# Patient Record
Sex: Male | Born: 1985 | Race: White | Hispanic: No | Marital: Married | Smoking: Current every day smoker
Health system: Southern US, Community
[De-identification: ages and names within clinical notes are randomized; demographics above are authoritative.]

## PROBLEM LIST (undated history)

## (undated) DIAGNOSIS — G8929 Other chronic pain: Secondary | ICD-10-CM

## (undated) DIAGNOSIS — F909 Attention-deficit hyperactivity disorder, unspecified type: Secondary | ICD-10-CM

## (undated) DIAGNOSIS — F32A Depression, unspecified: Secondary | ICD-10-CM

## (undated) HISTORY — DX: Attention-deficit hyperactivity disorder, unspecified type: F90.9

## (undated) HISTORY — DX: Depression, unspecified: F32.A

## (undated) HISTORY — DX: Other chronic pain: G89.29

---
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
## 2006-12-23 HISTORY — PX: TUMOR REMOVAL: SHX12

## 2019-03-02 DIAGNOSIS — L723 Sebaceous cyst: Secondary | ICD-10-CM | POA: Diagnosis not present

## 2021-01-11 ENCOUNTER — Telehealth: Payer: BC Managed Care – PPO | Admitting: Internal Medicine

## 2021-01-11 ENCOUNTER — Encounter: Payer: Self-pay | Admitting: Internal Medicine

## 2021-01-11 VITALS — BP 118/78 | HR 72 | Resp 20 | Ht 70.0 in | Wt 200.0 lb

## 2021-01-11 DIAGNOSIS — N521 Erectile dysfunction due to diseases classified elsewhere: Secondary | ICD-10-CM

## 2021-01-11 DIAGNOSIS — M25521 Pain in right elbow: Secondary | ICD-10-CM

## 2021-01-11 DIAGNOSIS — R0683 Snoring: Secondary | ICD-10-CM

## 2021-01-11 DIAGNOSIS — L74513 Primary focal hyperhidrosis, soles: Secondary | ICD-10-CM

## 2021-01-11 DIAGNOSIS — R079 Chest pain, unspecified: Secondary | ICD-10-CM

## 2021-01-11 DIAGNOSIS — M542 Cervicalgia: Secondary | ICD-10-CM | POA: Diagnosis not present

## 2021-01-11 DIAGNOSIS — K921 Melena: Secondary | ICD-10-CM

## 2021-01-11 DIAGNOSIS — G8929 Other chronic pain: Secondary | ICD-10-CM

## 2021-01-11 DIAGNOSIS — N50812 Left testicular pain: Secondary | ICD-10-CM
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
## 2021-01-14 ENCOUNTER — Encounter: Payer: Self-pay | Admitting: Internal Medicine

## 2021-01-14 DIAGNOSIS — M542 Cervicalgia: Secondary | ICD-10-CM | POA: Insufficient documentation

## 2021-01-14 DIAGNOSIS — G8929 Other chronic pain: Secondary | ICD-10-CM | POA: Insufficient documentation

## 2021-01-14 DIAGNOSIS — N521 Erectile dysfunction due to diseases classified elsewhere: Secondary | ICD-10-CM | POA: Insufficient documentation

## 2021-01-14 DIAGNOSIS — L74513 Primary focal hyperhidrosis, soles: Secondary | ICD-10-CM | POA: Insufficient documentation

## 2021-01-14 MED ORDER — ALUMINUM CHLORIDE 20 % EX SOLN: Freq: Every day | CUTANEOUS | 0 refills | Status: DC

## 2021-01-14 NOTE — Patient Instructions (Signed)

## 2021-01-14 NOTE — Assessment & Plan Note (Signed)
Could consider xray of cervical/toracic spine Encouraged daily stretching Continue Naproxen as needed- avoid overuse

## 2021-01-14 NOTE — Assessment & Plan Note (Signed)
Will trial Drysol

## 2021-01-14 NOTE — Assessment & Plan Note (Signed)
Will trial Sildenafil 25 mg as needed Discussed risk of priapism

## 2021-01-15 ENCOUNTER — Other Ambulatory Visit: Payer: BC Managed Care – PPO

## 2021-01-15 LAB — LIPID PANEL
Cholesterol: 142 mg/dL (ref 0–200)
HDL: 48.3 mg/dL (ref >39.00)
LDL Cholesterol: 80 mg/dL (ref 0–99)
NonHDL: 93.81
Total CHOL/HDL Ratio: 3
Triglycerides: 67 mg/dL (ref 0.0–149.0)
VLDL: 13.4 mg/dL (ref 0.0–40.0)

## 2021-01-15 LAB — COMPREHENSIVE METABOLIC PANEL
ALT: 34 U/L (ref 0–53)
AST: 25 U/L (ref 0–37)
Albumin: 3.9 g/dL (ref 3.5–5.2)
Alkaline Phosphatase: 51 U/L (ref 39–117)
BUN: 18 mg/dL (ref 6–23)
CO2: 26 mEq/L (ref 19–32)
Calcium: 8.8 mg/dL (ref 8.4–10.5)
Chloride: 105 mEq/L (ref 96–112)
Creatinine, Ser: 1.1 mg/dL (ref 0.40–1.50)
GFR: 86.3 mL/min (ref >60.00)
Glucose, Bld: 84 mg/dL (ref 70–99)
Potassium: 4.6 mEq/L (ref 3.5–5.1)
Sodium: 140 mEq/L (ref 135–145)
Total Bilirubin: 0.5 mg/dL (ref 0.2–1.2)
Total Protein: 5.9 g/dL — ABNORMAL LOW (ref 6.0–8.3)

## 2021-01-15 LAB — CBC
HCT: 44.1 % (ref 39.0–52.0)
Hemoglobin: 14.7 g/dL (ref 13.0–17.0)
MCHC: 33.4 g/dL (ref 30.0–36.0)
MCV: 90 fl (ref 78.0–100.0)
Platelets: 210 10*3/uL (ref 150.0–400.0)
RBC: 4.89 Mil/uL (ref 4.22–5.81)
RDW: 13.3 % (ref 11.5–15.5)
WBC: 6.5 10*3/uL (ref 4.0–10.5)

## 2021-01-15 LAB — HEMOGLOBIN A1C: Hgb A1c MFr Bld: 5.3 % (ref 4.6–6.5)

## 2021-01-15 LAB — TSH: TSH: 1.24 u[IU]/mL (ref 0.35–4.50)

## 2021-01-15 NOTE — Addendum Note (Signed)
Addended by: Lorre Munroe on: 01/15/2021 08:11 AM   Modules accepted: Orders

## 2021-01-16 ENCOUNTER — Other Ambulatory Visit: Payer: Self-pay | Admitting: Internal Medicine

## 2021-01-16 DIAGNOSIS — M25512 Pain in left shoulder: Secondary | ICD-10-CM

## 2021-01-16 DIAGNOSIS — G8929 Other chronic pain: Secondary | ICD-10-CM

## 2021-01-16 DIAGNOSIS — M542 Cervicalgia: Secondary | ICD-10-CM

## 2021-01-18 ENCOUNTER — Ambulatory Visit (INDEPENDENT_AMBULATORY_CARE_PROVIDER_SITE_OTHER): Payer: BC Managed Care – PPO

## 2021-01-18 ENCOUNTER — Other Ambulatory Visit: Payer: Self-pay

## 2021-01-18 ENCOUNTER — Ambulatory Visit (INDEPENDENT_AMBULATORY_CARE_PROVIDER_SITE_OTHER)
Admission: RE | Admit: 2021-01-18 | Discharge: 2021-01-18 | Disposition: A | Discharge status: Home / Self Care | Payer: BC Managed Care – PPO | Source: Ambulatory Visit | Attending: Internal Medicine | Admitting: Internal Medicine

## 2021-01-18 DIAGNOSIS — M542 Cervicalgia: Secondary | ICD-10-CM | POA: Diagnosis not present

## 2021-01-18 DIAGNOSIS — M25512 Pain in left shoulder: Secondary | ICD-10-CM

## 2021-01-18 DIAGNOSIS — M546 Pain in thoracic spine: Secondary | ICD-10-CM | POA: Diagnosis not present

## 2021-01-18 DIAGNOSIS — R079 Chest pain, unspecified: Secondary | ICD-10-CM | POA: Diagnosis not present

## 2021-01-18 DIAGNOSIS — G8929 Other chronic pain: Secondary | ICD-10-CM | POA: Diagnosis not present

## 2021-01-18 NOTE — Progress Notes (Signed)
Per orders of [name], NP, EKG completed by Roena Malady.

## 2021-02-10 ENCOUNTER — Other Ambulatory Visit: Payer: Self-pay | Admitting: Internal Medicine

## 2021-02-10 MED ORDER — SILDENAFIL CITRATE 50 MG PO TABS: 50.0000 mg | ORAL_TABLET | Freq: Every day | ORAL | 5 refills | Status: DC | PRN

## 2021-02-10 MED ORDER — ALUMINUM CHLORIDE 20 % EX SOLN
Freq: Every day | CUTANEOUS | 2 refills | Status: DC
Start: 2021-02-10 — End: 2021-03-01

## 2021-02-12 ENCOUNTER — Other Ambulatory Visit: Payer: Self-pay | Admitting: Internal Medicine

## 2021-02-12 MED ORDER — PREDNISONE 10 MG PO TABS: ORAL_TABLET | ORAL | 0 refills | Status: DC

## 2021-02-12 MED ORDER — CYCLOBENZAPRINE HCL 10 MG PO TABS: 10.0000 mg | ORAL_TABLET | Freq: Every evening | ORAL | 0 refills | Status: DC | PRN

## 2021-03-01 ENCOUNTER — Other Ambulatory Visit: Payer: Self-pay | Admitting: Internal Medicine

## 2021-03-01 MED ORDER — ALUMINUM CHLORIDE 20 % EX SOLN: Freq: Every day | CUTANEOUS | 2 refills | Status: DC

## 2021-03-19 ENCOUNTER — Other Ambulatory Visit: Payer: Self-pay | Admitting: Internal Medicine

## 2021-03-19 MED ORDER — HYDROCORTISONE ACETATE 25 MG RE SUPP: 25.0000 mg | Freq: Two times a day (BID) | RECTAL | 0 refills | Status: DC

## 2021-05-04 ENCOUNTER — Other Ambulatory Visit: Payer: Self-pay | Admitting: Internal Medicine

## 2021-05-04 MED ORDER — ALUMINUM CHLORIDE 20 % EX SOLN: Freq: Every day | CUTANEOUS | 2 refills | Status: DC

## 2021-06-18 ENCOUNTER — Other Ambulatory Visit: Payer: Self-pay | Admitting: Internal Medicine

## 2021-06-18 ENCOUNTER — Other Ambulatory Visit: Payer: Self-pay

## 2021-06-18 MED ORDER — SILDENAFIL CITRATE 50 MG PO TABS
50.0000 mg | ORAL_TABLET | Freq: Every day | ORAL | 5 refills | Status: AC | PRN
  Filled 2021-06-18: qty 4, 30d supply, fill #0
  Filled 2021-09-12: qty 4, 30d supply, fill #1

## 2021-06-18 MED ORDER — BENZOYL PEROXIDE-ERYTHROMYCIN 5-3 % EX GEL
Freq: Two times a day (BID) | CUTANEOUS | 0 refills | Status: DC
  Filled 2021-06-18: qty 46.6, 30d supply, fill #0

## 2021-06-18 MED ORDER — ALUMINUM CHLORIDE 20 % EX SOLN
Freq: Every day | CUTANEOUS | 2 refills | Status: DC
  Filled 2021-06-18: qty 60, 30d supply, fill #0
  Filled 2021-09-12: qty 70, 30d supply, fill #1
  Filled 2021-09-13: qty 70, 60d supply, fill #1

## 2021-06-19 ENCOUNTER — Other Ambulatory Visit: Payer: Self-pay

## 2021-06-26 ENCOUNTER — Other Ambulatory Visit: Payer: Self-pay | Admitting: Internal Medicine

## 2021-06-26 ENCOUNTER — Other Ambulatory Visit: Payer: Self-pay

## 2021-06-26 MED ORDER — CEPHALEXIN 500 MG PO CAPS
500.0000 mg | ORAL_CAPSULE | Freq: Three times a day (TID) | ORAL | 0 refills | Status: DC
  Filled 2021-06-26: qty 30, 10d supply, fill #0

## 2021-08-29 ENCOUNTER — Encounter: Payer: Self-pay | Admitting: Internal Medicine

## 2021-08-29 ENCOUNTER — Other Ambulatory Visit: Payer: Self-pay

## 2021-08-29 ENCOUNTER — Telehealth (INDEPENDENT_AMBULATORY_CARE_PROVIDER_SITE_OTHER): Payer: BC Managed Care – PPO | Admitting: Internal Medicine

## 2021-08-29 VITALS — Ht 70.0 in | Wt 190.0 lb

## 2021-08-29 DIAGNOSIS — M5414 Radiculopathy, thoracic region: Secondary | ICD-10-CM | POA: Diagnosis not present

## 2021-08-29 DIAGNOSIS — G8929 Other chronic pain: Secondary | ICD-10-CM

## 2021-08-29 DIAGNOSIS — M546 Pain in thoracic spine: Secondary | ICD-10-CM | POA: Diagnosis not present
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
## 2021-08-29 NOTE — Patient Instructions (Signed)
Scapular Winging Rehab Ask your health care provider which exercises are safe for you. Do exercises exactly as told by your health care provider and adjust them as directed. It is normal to feel mild stretching, pulling, tightness, or discomfort as you do these exercises. Stop right away if you feel sudden pain or your pain gets worse. Do not begin these exercises until told by your health care provider. Strengthening exercises These exercises build strength and endurance in your shoulder. Endurance is the ability to use your muscles for a long time, even after they get tired. Scapular depression and retraction After you have practiced this exercise, try doing it without the arm support. Then, try doing it while standing instead of sitting. Sit on a stable chair. Support your arms in front of you with pillows, armrests, or a tabletop. Keep your elbows near the sides of your body. Gently move your shoulder blades down (scapular depression) and back toward your spine (retraction). Relax the muscles on the tops of your shoulders and in the back of your neck. Hold for __________ seconds. Slowly release the tension, and relax your muscles completely before you repeat the exercise. Repeat __________ times. Complete this exercise __________ times a day. Scapular protraction, standing  Stand so you are facing a wall, about one arm-length away from the wall. Place your hands on the wall and straighten your elbows. Move your shoulder blades down, toward the middle of your spine. Keep your shoulder blades down and move them forward, toward the wall (protraction). You should feel your shoulder blades sliding forward, around your rib cage. If you are not sure that you are doing this exercise correctly, ask your health care provider for more instructions. Hold for __________ seconds. Slowly return to the starting position. Let your muscles relax completely before you repeat this exercise. Repeat __________  times. Complete this exercise __________ times a day. Scapular protraction, supine  Lie on your back on a firm surface (supine position). Hold a __________ weight in your left / right hand. Raise your left / right arm straight into the air so your hand is directly above your shoulder joint. Push the weight into the air so your shoulder blade lifts off the surface that you are lying on. The scapula will push forward (protraction). Do not move your head, neck, or back. Hold for __________ seconds. Slowly return to the starting position. Let your muscles relax completely before you repeat this exercise. Repeat __________ times. Complete this exercise __________ times a day. Scapular protraction, quadruped  Get on your hands and knees (quadruped position). Your hands should be directly below your shoulder blades (scapulae). Straighten your arms until your elbows are locked. Round your back as much as you can. Think about lifting your rib cage up into your shoulder blades. The scapula will push downward or forward (protraction). Keep your neck muscles relaxed. Hold for __________ seconds. Slowly return to the starting position. Let your muscles relax completely before you repeat this exercise. Repeat __________ times. Complete this exercise __________ times a day. Scapular depression  Sit in a stable chair that has armrests. Sit upright, with your feet flat on the floor. Put your hands on the armrests with your elbows bent and your fingers pointing forward. Your hands should be about even with the sides of your body. Push down on the armrests to lift yourself off the chair. Straighten your elbows and lift yourself up as much as you comfortably can. Move your shoulder blades down and back (  scapular depression). Do not let your shoulders move up toward your ears. Keep your feet on the ground. As you get stronger, your feet should support less of your body weight as you do this exercise. Hold for  __________ seconds. Slowly lower yourself back into the chair. Repeat __________ times. Complete this exercise __________ times a day. Shoulder extension, prone  Lie on your abdomen on a firm surface (prone position) so your left / right arm hangs over the edge. Hold a __________ weight in your left / right hand so your palm faces in toward your body. Your arm should be straight. Squeeze your shoulder blade down toward the middle of your back. Slowly raise your arm behind you and toward the ceiling, up to the height of the surface that you are lying on (extension). Keep your arm straight. Hold for __________ seconds. Slowly return to the starting position and relax your muscles. Repeat __________ times. Complete this exercise __________ times a day. Horizontal abduction, prone Lie on your abdomen on a firm surface (prone position) so your left / right arm hangs over the edge. Hold a __________ weight in your hand so your palm faces toward your feet. Your arm should be straight. Squeeze your shoulder blade down toward the middle of your back. Raise your left/right arm up to the height of the surface that you are lying on. Keep your arm straight and point your thumb forward. At the top of the movement, your palm should face the floor. Hold for __________ seconds. Slowly return to the starting position and relax your muscles. Repeat __________ times. Complete this exercise __________ times a day. Scapular retraction  Sit in a stable chair without armrests, or stand. Secure an exercise band to a stable object in front of you so it is at shoulder height. Hold one end of the exercise band in each hand. Your palms should face down. Straighten your elbows and lift your arms up to shoulder height. Step back, away from the secured end of the exercise band, until the band stretches. Squeeze your shoulder blades together (scapular retraction) and move your elbows back behind you. Do not shrug your  shoulders while you do this. Your elbows should stay at about chest or shoulder height. Keep your upper arms lifted, away from your sides. Hold for __________ seconds. Slowly return to the starting position. Repeat __________ times. Complete this exercise __________ times a day. Shoulder extension  Sit in a stable chair without armrests, or stand. Secure an exercise band to a stable object in front of you so it is at shoulder height. Hold one end of the exercise band in each hand. Your palms should face each other. Straighten your elbows and lift your hands up to shoulder height. Step back, away from the secured end of the exercise band, until the band stretches. Squeeze your shoulder blades together and pull your hands down to the sides of your thighs (extension). Stop when your hands are straight down by your sides. Do not let your hands go behind your body. Hold for __________ seconds. Slowly return to the starting position. Repeat __________ times. Complete this exercise __________ times a day. Scapular retraction and external rotation  Sit in a stable chair without armrests, or stand. Secure an exercise band to a stable object in front of you so it is at shoulder height. Hold one end of the exercise band in each hand. Your palms should face down. Straighten your elbows and lift your hands up to  shoulder height. Step back, away from the secured end of the exercise band, until the band stretches. Bend your elbows and raise your hands up to the height of your head. This is external rotation. Your palms should face out, in front of you, at the top of the movement. Squeeze your shoulder blades together during this movement. This is scapular retraction. Hold for __________ seconds. Slowly straighten your arms to return to the starting position. Repeat __________ times. Complete this exercise __________ times a day. This information is not intended to replace advice given to you by your  health care provider. Make sure you discuss any questions you have with your health care provider. Document Revised: 04/02/2019 Document Reviewed: 10/15/2018 Elsevier Patient Education  2022 [employer].

## 2021-09-07 ENCOUNTER — Telehealth: Payer: Self-pay

## 2021-09-07 DIAGNOSIS — M5414 Radiculopathy, thoracic region: Secondary | ICD-10-CM

## 2021-09-07 NOTE — Addendum Note (Signed)
Addended by: Lorre Munroe on: 09/07/2021 12:12 PM   Modules accepted: Orders

## 2021-09-07 NOTE — Telephone Encounter (Signed)
Copied from CRM [id number]. Topic: Referral - Status >> Sep 06, 2021 12:22 PM Crist Infante wrote: Reason for CRM: [employer] calling to request the referral be sent to [facility] for the MRI. New order needs to be sent, faxed to [phone] Ref no. [id number]

## 2021-09-07 NOTE — Telephone Encounter (Signed)
New MRI ordered placed. Can we get this faxed to [facility]?

## 2021-09-12 ENCOUNTER — Other Ambulatory Visit: Payer: Self-pay | Admitting: Internal Medicine

## 2021-09-12 ENCOUNTER — Ambulatory Visit: Payer: BC Managed Care – PPO

## 2021-09-12 ENCOUNTER — Other Ambulatory Visit: Payer: Self-pay

## 2021-09-12 MED ORDER — BENZOYL PEROXIDE-ERYTHROMYCIN 5-3 % EX GEL
Freq: Two times a day (BID) | CUTANEOUS | 5 refills | Status: DC
  Filled 2021-09-12: qty 46.6, 30d supply, fill #0

## 2021-09-12 MED FILL — Benzoyl Peroxide-Erythromycin Gel 5-3%: CUTANEOUS | 30 days supply | Qty: 46.6 | Fill #0 | Status: CN

## 2021-09-13 ENCOUNTER — Other Ambulatory Visit: Payer: Self-pay

## 2021-09-14 ENCOUNTER — Other Ambulatory Visit: Payer: Self-pay | Admitting: Internal Medicine

## 2021-09-25 DIAGNOSIS — K1321 Leukoplakia of oral mucosa, including tongue: Secondary | ICD-10-CM | POA: Diagnosis not present

## 2021-10-02 ENCOUNTER — Encounter: Payer: Self-pay | Admitting: Internal Medicine

## 2021-10-02 DIAGNOSIS — M5414 Radiculopathy, thoracic region: Secondary | ICD-10-CM | POA: Diagnosis not present

## 2021-10-19 DIAGNOSIS — K1329 Other disturbances of oral epithelium, including tongue: Secondary | ICD-10-CM | POA: Diagnosis not present

## 2021-10-22 DIAGNOSIS — K1329 Other disturbances of oral epithelium, including tongue: Secondary | ICD-10-CM | POA: Diagnosis not present

## 2021-12-06 ENCOUNTER — Other Ambulatory Visit: Payer: Self-pay | Admitting: Internal Medicine

## 2021-12-06 ENCOUNTER — Other Ambulatory Visit: Payer: Self-pay

## 2021-12-06 MED ORDER — PHENTERMINE HCL 37.5 MG PO CAPS
37.5000 mg | ORAL_CAPSULE | ORAL | 2 refills | Status: DC
  Filled 2021-12-06: qty 30, 30d supply, fill #0
  Filled 2022-02-27: qty 30, 30d supply, fill #1

## 2022-02-12 ENCOUNTER — Other Ambulatory Visit: Payer: Self-pay | Admitting: Internal Medicine

## 2022-02-12 ENCOUNTER — Other Ambulatory Visit: Payer: Self-pay

## 2022-02-12 MED ORDER — HYDROCORTISONE ACE-PRAMOXINE 1-1 % EX CREA
1.0000 "application " | TOPICAL_CREAM | Freq: Two times a day (BID) | CUTANEOUS | 0 refills | Status: DC
  Filled 2022-02-12: qty 30, 15d supply, fill #0

## 2022-02-12 MED ORDER — HYDROCORTISONE ACETATE 25 MG RE SUPP
25.0000 mg | Freq: Two times a day (BID) | RECTAL | 0 refills | Status: DC
Start: 2022-02-12 — End: 2022-02-12
  Filled 2022-02-12: qty 12, 6d supply, fill #0

## 2022-02-14 ENCOUNTER — Other Ambulatory Visit: Payer: Self-pay

## 2022-02-14 MED ORDER — HYDROCORTISONE (PERIANAL) 2.5 % EX CREA
TOPICAL_CREAM | CUTANEOUS | 0 refills | Status: DC
  Filled 2022-02-14: qty 28, 15d supply, fill #0
  Filled 2022-02-27: qty 28, 7d supply, fill #0

## 2022-02-22 ENCOUNTER — Other Ambulatory Visit: Payer: Self-pay

## 2022-02-27 ENCOUNTER — Other Ambulatory Visit: Payer: Self-pay

## 2022-03-15 ENCOUNTER — Other Ambulatory Visit: Payer: Self-pay

## 2022-03-15 DIAGNOSIS — S92334A Nondisplaced fracture of third metatarsal bone, right foot, initial encounter for closed fracture: Secondary | ICD-10-CM | POA: Diagnosis not present

## 2022-03-15 DIAGNOSIS — M79641 Pain in right hand: Secondary | ICD-10-CM | POA: Diagnosis not present

## 2022-03-15 DIAGNOSIS — M79671 Pain in right foot: Secondary | ICD-10-CM | POA: Diagnosis not present

## 2022-03-15 DIAGNOSIS — M25531 Pain in right wrist: Secondary | ICD-10-CM | POA: Diagnosis not present

## 2022-03-15 DIAGNOSIS — S92344A Nondisplaced fracture of fourth metatarsal bone, right foot, initial encounter for closed fracture: Secondary | ICD-10-CM | POA: Diagnosis not present

## 2022-03-15 DIAGNOSIS — S6991XA Unspecified injury of right wrist, hand and finger(s), initial encounter: Secondary | ICD-10-CM | POA: Diagnosis not present

## 2022-03-15 MED ORDER — OXYCODONE-ACETAMINOPHEN 5-325 MG PO TABS
ORAL_TABLET | ORAL | 0 refills | Status: DC
  Filled 2022-03-15: qty 20, 5d supply, fill #0

## 2022-03-15 MED ORDER — METHOCARBAMOL 500 MG PO TABS
ORAL_TABLET | ORAL | 0 refills | Status: DC
  Filled 2022-03-15: qty 45, 15d supply, fill #0

## 2022-03-18 ENCOUNTER — Ambulatory Visit
Admission: RE | Admit: 2022-03-18 | Discharge: 2022-03-18 | Disposition: A | Discharge status: Home / Self Care | Payer: BC Managed Care – PPO | Source: Ambulatory Visit | Attending: Internal Medicine | Admitting: Internal Medicine

## 2022-03-18 ENCOUNTER — Ambulatory Visit
Admission: RE | Admit: 2022-03-18 | Discharge: 2022-03-18 | Disposition: A | Discharge status: Home / Self Care | Payer: BC Managed Care – PPO | Attending: Internal Medicine | Admitting: Internal Medicine

## 2022-03-18 ENCOUNTER — Other Ambulatory Visit: Payer: Self-pay

## 2022-03-18 ENCOUNTER — Other Ambulatory Visit: Payer: Self-pay | Admitting: Internal Medicine

## 2022-03-18 DIAGNOSIS — M79641 Pain in right hand: Secondary | ICD-10-CM | POA: Diagnosis not present

## 2022-03-22 ENCOUNTER — Other Ambulatory Visit: Payer: Self-pay

## 2022-03-22 MED ORDER — TIZANIDINE HCL 4 MG PO TABS
ORAL_TABLET | ORAL | 0 refills | Status: DC
Start: 2022-03-22 — End: 2022-08-08
  Filled 2022-03-22: qty 120, 30d supply, fill #0

## 2022-03-22 MED ORDER — MELOXICAM 7.5 MG PO TABS
ORAL_TABLET | ORAL | 0 refills | Status: DC
Start: 2022-03-22 — End: 2022-08-08
  Filled 2022-03-22: qty 30, 30d supply, fill #0

## 2022-03-22 MED ORDER — GABAPENTIN 300 MG PO CAPS
ORAL_CAPSULE | ORAL | 0 refills | Status: DC
Start: 2022-03-22 — End: 2022-08-08
  Filled 2022-03-22: qty 60, 30d supply, fill #0

## 2022-04-27 ENCOUNTER — Other Ambulatory Visit (HOSPITAL_COMMUNITY): Payer: Self-pay

## 2022-05-16 ENCOUNTER — Other Ambulatory Visit: Payer: Self-pay | Admitting: Internal Medicine

## 2022-05-16 MED ORDER — PHENTERMINE HCL 37.5 MG PO CAPS: 37.5000 mg | ORAL_CAPSULE | ORAL | 2 refills | Status: DC

## 2022-05-16 MED ORDER — INSULIN PEN NEEDLE 31G X 5 MM MISC
1 refills | Status: DC
Start: 2022-05-16 — End: 2022-08-08

## 2022-07-12 ENCOUNTER — Other Ambulatory Visit: Payer: Self-pay

## 2022-07-30 ENCOUNTER — Other Ambulatory Visit (HOSPITAL_COMMUNITY): Payer: Self-pay

## 2022-08-08 ENCOUNTER — Other Ambulatory Visit: Payer: Self-pay | Admitting: Internal Medicine

## 2022-08-08 MED ORDER — NITROGLYCERIN 0.4 % RE OINT: TOPICAL_OINTMENT | RECTAL | 0 refills | Status: DC

## 2022-09-10 ENCOUNTER — Other Ambulatory Visit: Payer: Self-pay | Admitting: Internal Medicine

## 2022-09-10 MED ORDER — NITROFURANTOIN MONOHYD MACRO 100 MG PO CAPS: 100.0000 mg | ORAL_CAPSULE | Freq: Two times a day (BID) | ORAL | 0 refills | Status: DC

## 2022-09-10 MED ORDER — FLUCONAZOLE 150 MG PO TABS: 150.0000 mg | ORAL_TABLET | Freq: Once | ORAL | 0 refills | Status: AC

## 2023-02-04 ENCOUNTER — Other Ambulatory Visit: Payer: Self-pay | Admitting: Internal Medicine

## 2023-02-04 MED ORDER — INSULIN PEN NEEDLE 31G X 5 MM MISC: 3 refills | Status: DC

## 2023-02-04 MED ORDER — CEPHALEXIN 500 MG PO CAPS: 500.0000 mg | ORAL_CAPSULE | Freq: Three times a day (TID) | ORAL | 0 refills | Status: DC

## 2023-02-18 ENCOUNTER — Other Ambulatory Visit: Payer: Self-pay | Admitting: Internal Medicine

## 2023-02-18 MED ORDER — DOXYCYCLINE HYCLATE 100 MG PO TABS: 100.0000 mg | ORAL_TABLET | Freq: Two times a day (BID) | ORAL | 0 refills | Status: DC

## 2023-02-18 MED ORDER — PHENTERMINE HCL 37.5 MG PO CAPS: 37.5000 mg | ORAL_CAPSULE | ORAL | 0 refills | Status: DC

## 2023-06-20 ENCOUNTER — Other Ambulatory Visit: Payer: Self-pay | Admitting: Internal Medicine

## 2023-06-20 MED ORDER — PREDNISONE 10 MG PO TABS
10.0000 mg | ORAL_TABLET | Freq: Every day | ORAL | 0 refills | Status: DC
Start: 2023-06-20 — End: 2023-08-04

## 2023-07-01 ENCOUNTER — Other Ambulatory Visit: Payer: Self-pay | Admitting: Internal Medicine

## 2023-07-01 MED ORDER — CEPHALEXIN 500 MG PO CAPS: 500.0000 mg | ORAL_CAPSULE | Freq: Three times a day (TID) | ORAL | 0 refills | Status: DC

## 2023-08-04 ENCOUNTER — Other Ambulatory Visit: Payer: Self-pay | Admitting: Internal Medicine
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
## 2023-08-04 MED ORDER — CIPROFLOXACIN HCL 500 MG PO TABS: 500.0000 mg | ORAL_TABLET | Freq: Two times a day (BID) | ORAL | 0 refills | Status: DC

## 2023-08-11 ENCOUNTER — Ambulatory Visit (INDEPENDENT_AMBULATORY_CARE_PROVIDER_SITE_OTHER): Payer: 59 | Admitting: Nurse Practitioner

## 2023-08-11 ENCOUNTER — Encounter: Payer: Self-pay | Admitting: Nurse Practitioner

## 2023-08-11 VITALS — BP 110/88 | HR 75 | Temp 98.9°F | Ht 69.0 in | Wt 213.8 lb

## 2023-08-11 DIAGNOSIS — Z1159 Encounter for screening for other viral diseases: Secondary | ICD-10-CM | POA: Diagnosis not present

## 2023-08-11 DIAGNOSIS — Z114 Encounter for screening for human immunodeficiency virus [HIV]: Secondary | ICD-10-CM | POA: Diagnosis not present

## 2023-08-11 DIAGNOSIS — Z13220 Encounter for screening for lipoid disorders: Secondary | ICD-10-CM | POA: Diagnosis not present

## 2023-08-11 DIAGNOSIS — Z0000 Encounter for general adult medical examination without abnormal findings: Secondary | ICD-10-CM | POA: Diagnosis not present

## 2023-08-11 DIAGNOSIS — E669 Obesity, unspecified: Secondary | ICD-10-CM

## 2023-08-11 DIAGNOSIS — Z720 Tobacco use: Secondary | ICD-10-CM

## 2023-08-11 LAB — CBC
HCT: 45.8 % (ref 39.0–52.0)
Hemoglobin: 15 g/dL (ref 13.0–17.0)
MCHC: 32.7 g/dL (ref 30.0–36.0)
MCV: 89.5 fl (ref 78.0–100.0)
Platelets: 297 10*3/uL (ref 150.0–400.0)
RBC: 5.12 Mil/uL (ref 4.22–5.81)
RDW: 13.8 % (ref 11.5–15.5)
WBC: 7.2 10*3/uL (ref 4.0–10.5)

## 2023-08-11 LAB — TSH: TSH: 0.93 u[IU]/mL (ref 0.35–5.50)

## 2023-08-11 LAB — URINALYSIS, MICROSCOPIC ONLY
RBC / HPF: NONE SEEN (ref 0–?)
WBC, UA: NONE SEEN (ref 0–?)

## 2023-08-11 LAB — HEMOGLOBIN A1C: Hgb A1c MFr Bld: 5.5 % (ref 4.6–6.5)

## 2023-08-11 NOTE — Patient Instructions (Signed)
Nice to see you today I will be in touch with the labs once I have reviewed them Follow up with me in 1 year, sooner if you need me  
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
## 2023-08-11 NOTE — Assessment & Plan Note (Signed)
Pending TSH, A1c, lipid panel.  Work on healthy lifestyle modifications inclusive of exercise 30 minutes a day 5 times a week.

## 2023-08-11 NOTE — Assessment & Plan Note (Signed)
Discussed age-appropriate immunizations and screening exams.  Did review patient's personal, surgical, social, family histories.  Patient is up-to-date with all age-appropriate vaccinations that he would like.  Patient is too young for CRC screening or prostate cancer screening.  Patient was given information at discharge about preventative healthcare maintenance with anticipatory guidance.  Did inform patient that the recommendation is no more than 2 alcoholic beverages at 1 setting per day.

## 2023-08-11 NOTE — Assessment & Plan Note (Signed)
Patient current everyday smoker.  Check urine microscopy for microscopic hematuria

## 2023-08-12 ENCOUNTER — Encounter: Payer: Self-pay | Admitting: Nurse Practitioner

## 2023-08-12 LAB — LIPID PANEL
Cholesterol: 216 mg/dL — ABNORMAL HIGH (ref 0–200)
HDL: 54.1 mg/dL (ref >39.00)
LDL Cholesterol: 151 mg/dL — ABNORMAL HIGH (ref 0–99)
NonHDL: 162.22
Total CHOL/HDL Ratio: 4
Triglycerides: 55 mg/dL (ref 0.0–149.0)
VLDL: 11 mg/dL (ref 0.0–40.0)

## 2023-08-12 LAB — COMPREHENSIVE METABOLIC PANEL
ALT: 48 U/L (ref 0–53)
AST: 26 U/L (ref 0–37)
Albumin: 4.6 g/dL (ref 3.5–5.2)
Alkaline Phosphatase: 87 U/L (ref 39–117)
BUN: 13 mg/dL (ref 6–23)
CO2: 25 meq/L (ref 19–32)
Calcium: 9.6 mg/dL (ref 8.4–10.5)
Chloride: 104 mEq/L (ref 96–112)
Creatinine, Ser: 1 mg/dL (ref 0.40–1.50)
GFR: 95.03 mL/min (ref >60.00)
Glucose, Bld: 97 mg/dL (ref 70–99)
Potassium: 4 meq/L (ref 3.5–5.1)
Sodium: 139 meq/L (ref 135–145)
Total Bilirubin: 0.7 mg/dL (ref 0.2–1.2)
Total Protein: 7.2 g/dL (ref 6.0–8.3)

## 2023-08-12 LAB — HEPATITIS C ANTIBODY: Hepatitis C Ab: NONREACTIVE

## 2023-08-12 LAB — HIV ANTIBODY (ROUTINE TESTING W REFLEX): HIV 1&2 Ab, 4th Generation: NONREACTIVE

## 2023-09-12 ENCOUNTER — Encounter: Payer: Self-pay | Admitting: Nurse Practitioner

## 2023-10-13 ENCOUNTER — Other Ambulatory Visit: Payer: Self-pay | Admitting: Internal Medicine

## 2023-10-13 MED ORDER — AZITHROMYCIN 250 MG PO TABS: ORAL_TABLET | ORAL | 0 refills | Status: DC

## 2023-12-04 ENCOUNTER — Other Ambulatory Visit: Payer: Self-pay | Admitting: Internal Medicine

## 2023-12-15 ENCOUNTER — Other Ambulatory Visit: Payer: Self-pay | Admitting: Internal Medicine

## 2023-12-15 MED ORDER — HYDROCORTISONE (PERIANAL) 2.5 % EX CREA: 1.0000 | TOPICAL_CREAM | Freq: Two times a day (BID) | CUTANEOUS | 0 refills | Status: DC

## 2024-02-22 ENCOUNTER — Other Ambulatory Visit: Payer: Self-pay | Admitting: Internal Medicine

## 2024-02-22 MED ORDER — POLYMYXIN B-TRIMETHOPRIM 10000-0.1 UNIT/ML-% OP SOLN: 1.0000 [drp] | OPHTHALMIC | 0 refills | Status: DC

## 2024-03-03 ENCOUNTER — Other Ambulatory Visit: Payer: Self-pay | Admitting: Internal Medicine

## 2024-03-03 MED ORDER — PREDNISONE 10 MG PO TABS: 10.0000 mg | ORAL_TABLET | Freq: Every day | ORAL | 0 refills | Status: DC

## 2024-03-03 MED ORDER — AZITHROMYCIN 250 MG PO TABS: ORAL_TABLET | ORAL | 0 refills | Status: DC

## 2024-04-05 ENCOUNTER — Ambulatory Visit: Admitting: Nurse Practitioner

## 2024-04-05 ENCOUNTER — Encounter: Payer: Self-pay | Admitting: Nurse Practitioner

## 2024-04-05 VITALS — BP 130/88 | HR 78 | Temp 98.1°F | Ht 69.0 in | Wt 220.2 lb

## 2024-04-05 DIAGNOSIS — L0291 Cutaneous abscess, unspecified: Secondary | ICD-10-CM | POA: Diagnosis not present

## 2024-04-05 DIAGNOSIS — L74513 Primary focal hyperhidrosis, soles: Secondary | ICD-10-CM

## 2024-04-05 MED ORDER — DOXYCYCLINE HYCLATE 100 MG PO TABS: 100.0000 mg | ORAL_TABLET | Freq: Two times a day (BID) | ORAL | 0 refills | Status: AC

## 2024-04-05 NOTE — Assessment & Plan Note (Signed)
 Patient can use an aluminum chloride hexahydrate nightly with washing the morning.  What sweating is more to control patient can do 2-3 times weekly.  First patient did bring an extra pair of socks at work if socks become soiled with sweat

## 2024-04-05 NOTE — Patient Instructions (Addendum)
 Nice to see you today You are due for your physical in approx 4 months Try the drysol to the feet at night. Be sure to wash off in the morning. If the sweating starts doing better you can decrease to 2-3 times a week

## 2024-04-05 NOTE — Progress Notes (Signed)
 Acute Office Visit  Subjective:     Patient ID: Tyler Ali, male    DOB: 09-May-1986, 38 y.o.   MRN: [id number]  Chief Complaint  Patient presents with   Recurrent Skin Infections    Pt complains of boil near groin/thigh area that has been there for a month. Pt states it can get tender and painful. Pt states he has drained it a few times.    Foot Problem    Pt complains of possible fungal infection. States he has this issue for a couple years on and off.     HPI Patient is in today for multiple complaints with a history of hyperhidrosis of feet, obesity, and tobacco use  Has  histroy of hyperhydrosis and has dealt with it for years. He use to use drysol for the sweatin gin the past. He does work with sheet metal and socks get moist  States that he has gotten a boil on the inside of his leg and has been there for a month. More so in the summer than the winter. Had to have it lanced a few times. States that it has been opened within the past month. States that he has not had any in the arm pits. States that he does not have a fever and has not tried any antibtio  Review of Systems  Constitutional:  Negative for chills and fever.  Respiratory:  Negative for shortness of breath.   Cardiovascular:  Negative for chest pain.  Skin:        "+" lesion        Objective:    BP 130/88   Pulse 78   Temp 98.1 F (36.7 C) (Oral)   Ht 5\' 9"  (1.753 m)   Wt 220 lb 3.2 oz (99.9 kg)   SpO2 95%   BMI 32.52 kg/m  BP Readings from Last 3 Encounters:  04/05/24 130/88  08/11/23 110/88  01/11/21 118/78   Wt Readings from Last 3 Encounters:  04/05/24 220 lb 3.2 oz (99.9 kg)  08/11/23 213 lb 12.8 oz (97 kg)  08/29/21 190 lb (86.2 kg)   SpO2 Readings from Last 3 Encounters:  04/05/24 95%  08/11/23 97%  01/11/21 95%      Physical Exam Vitals and nursing note reviewed.  Constitutional:      Appearance: Normal appearance.  Cardiovascular:     Rate and Rhythm: Normal rate and  regular rhythm.     Heart sounds: Normal heart sounds.  Pulmonary:     Effort: Pulmonary effort is normal.     Breath sounds: Normal breath sounds.  Feet:     Comments: Peeling skin from maceration on plantar surface. No rash appreciated  Skin:    Findings: Lesion present.       Neurological:     Mental Status: He is alert.     No results found for any visits on 04/05/24.      Assessment & Plan:   Problem List Items Addressed This Visit       Musculoskeletal and Integument   Hyperhidrosis of feet - Primary   Patient can use an aluminum chloride hexahydrate nightly with washing the morning.  What sweating is more to control patient can do 2-3 times weekly.  First patient did bring an extra pair of socks at work if socks become soiled with sweat        Other   Abscess   Recurrent abscess to the thigh area.  An area with skin  does rub and hair present.  Patient has had I&D in the past area of induration today no fluctuance.  Will treat with doxycycline 100 mg twice daily.      Relevant Medications   doxycycline (VIBRA-TABS) 100 MG tablet    Meds ordered this encounter  Medications   doxycycline (VIBRA-TABS) 100 MG tablet    Sig: Take 1 tablet (100 mg total) by mouth 2 (two) times daily for 7 days.    Dispense:  14 tablet    Refill:  0    Supervising Provider:   Deri Fleet A [[id number]]    Return in about 4 months (around 08/05/2024).  Margarie Shay, NP

## 2024-04-05 NOTE — Assessment & Plan Note (Signed)
 Recurrent abscess to the thigh area.  An area with skin does rub and hair present.  Patient has had I&D in the past area of induration today no fluctuance.  Will treat with doxycycline 100 mg twice daily.

## 2024-05-30 ENCOUNTER — Other Ambulatory Visit: Payer: Self-pay | Admitting: Internal Medicine

## 2024-08-04 ENCOUNTER — Encounter: Payer: Self-pay | Admitting: Nurse Practitioner

## 2024-08-04 DIAGNOSIS — Z0100 Encounter for examination of eyes and vision without abnormal findings: Secondary | ICD-10-CM | POA: Diagnosis not present

## 2024-08-05 ENCOUNTER — Other Ambulatory Visit: Payer: Self-pay

## 2024-08-11 ENCOUNTER — Encounter: Payer: Self-pay | Admitting: Primary Care

## 2024-08-11 ENCOUNTER — Ambulatory Visit (INDEPENDENT_AMBULATORY_CARE_PROVIDER_SITE_OTHER): Admitting: Primary Care

## 2024-08-11 VITALS — BP 118/76 | HR 81 | Temp 97.9°F | Ht 69.0 in | Wt 219.0 lb

## 2024-08-11 DIAGNOSIS — Z131 Encounter for screening for diabetes mellitus: Secondary | ICD-10-CM | POA: Diagnosis not present

## 2024-08-11 DIAGNOSIS — E785 Hyperlipidemia, unspecified: Secondary | ICD-10-CM

## 2024-08-11 DIAGNOSIS — Z0000 Encounter for general adult medical examination without abnormal findings: Secondary | ICD-10-CM | POA: Diagnosis not present

## 2024-08-11 DIAGNOSIS — L74513 Primary focal hyperhidrosis, soles: Secondary | ICD-10-CM

## 2024-08-11 NOTE — Assessment & Plan Note (Signed)
 Immunizations UTD per patient  Discussed the importance of a healthy diet and regular exercise in order for weight loss, and to reduce the risk of further co-morbidity.  Exam stable. Labs pending.  Follow up in 1 year for repeat physical.

## 2024-08-11 NOTE — Assessment & Plan Note (Addendum)
 Controlled.  Continue Drysol PRN

## 2024-08-11 NOTE — Patient Instructions (Signed)
 Stop by the lab prior to leaving today. I will notify you of your results once received.   It was a pleasure to see you today!
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
## 2024-08-12 ENCOUNTER — Other Ambulatory Visit: Payer: Self-pay

## 2024-08-12 ENCOUNTER — Ambulatory Visit: Payer: Self-pay | Admitting: Primary Care

## 2024-08-12 LAB — CBC
HCT: 44.8 % (ref 39.0–52.0)
Hemoglobin: 14.9 g/dL (ref 13.0–17.0)
MCHC: 33.2 g/dL (ref 30.0–36.0)
MCV: 89.7 fl (ref 78.0–100.0)
Platelets: 236 K/uL (ref 150.0–400.0)
RBC: 4.99 Mil/uL (ref 4.22–5.81)
RDW: 13.1 % (ref 11.5–15.5)
WBC: 7.4 K/uL (ref 4.0–10.5)

## 2024-08-12 LAB — COMPREHENSIVE METABOLIC PANEL WITH GFR
ALT: 57 U/L — ABNORMAL HIGH (ref 0–53)
AST: 35 U/L (ref 0–37)
Albumin: 4.7 g/dL (ref 3.5–5.2)
Alkaline Phosphatase: 73 U/L (ref 39–117)
BUN: 15 mg/dL (ref 6–23)
CO2: 29 meq/L (ref 19–32)
Calcium: 9.5 mg/dL (ref 8.4–10.5)
Chloride: 100 meq/L (ref 96–112)
Creatinine, Ser: 1.04 mg/dL (ref 0.40–1.50)
GFR: 90.02 mL/min (ref >60.00)
Glucose, Bld: 80 mg/dL (ref 70–99)
Potassium: 4.6 meq/L (ref 3.5–5.1)
Sodium: 139 meq/L (ref 135–145)
Total Bilirubin: 0.9 mg/dL (ref 0.2–1.2)
Total Protein: 7.1 g/dL (ref 6.0–8.3)

## 2024-08-12 LAB — LIPID PANEL
Cholesterol: 182 mg/dL (ref 0–200)
HDL: 52.2 mg/dL (ref >39.00)
LDL Cholesterol: 121 mg/dL — ABNORMAL HIGH (ref 0–99)
NonHDL: 129.97
Total CHOL/HDL Ratio: 3
Triglycerides: 46 mg/dL (ref 0.0–149.0)
VLDL: 9.2 mg/dL (ref 0.0–40.0)

## 2024-08-12 LAB — HEMOGLOBIN A1C: Hgb A1c MFr Bld: 5.6 % (ref 4.6–6.5)

## 2024-08-29 ENCOUNTER — Encounter

## 2024-09-07 DIAGNOSIS — L03116 Cellulitis of left lower limb: Secondary | ICD-10-CM

## 2024-09-07 MED ORDER — PREDNISONE 20 MG PO TABS: ORAL_TABLET | ORAL | 0 refills | Status: DC

## 2024-09-07 MED ORDER — AMOXICILLIN-POT CLAVULANATE 875-125 MG PO TABS: 1.0000 | ORAL_TABLET | Freq: Two times a day (BID) | ORAL | 0 refills | Status: DC

## 2024-09-21 ENCOUNTER — Ambulatory Visit: Admitting: Nurse Practitioner

## 2024-09-21 VITALS — BP 124/82 | HR 76 | Temp 98.3°F | Ht 69.0 in | Wt 219.2 lb

## 2024-09-21 DIAGNOSIS — L0292 Furuncle, unspecified: Secondary | ICD-10-CM | POA: Diagnosis not present

## 2024-09-21 NOTE — Patient Instructions (Signed)
 Nice to see you today I have made a referral to Dermatology, they should reach out to you in approx 2 weeks Follow up with me as needed

## 2024-09-21 NOTE — Progress Notes (Signed)
 Acute Office Visit  Subjective:     Patient ID: Tyler Ali, male    DOB: 07/04/1986, 38 y.o.   MRN: [id number]  Chief Complaint  Patient presents with   Blisters    Pt complains of off and on boils for a few years. States they are becoming more frequent. Blisters are painful. Would like referral to derm.     HPI  Discussed the use of AI scribe software for clinical note transcription with the patient, who gave verbal consent to proceed.  History of Present Illness Tyler Ali is a 38 year old male with hidradenitis suppurativa who presents with recurrent lesions.  He has been experiencing hidradenitis suppurativa for several years, with exacerbations during the summer months. Recently, the lesions have increased in frequency, primarily affecting his leg, without involvement of the scrotum or genitals.  He has previously been treated with antibiotics, including Augmentin , which provided partial relief but did not completely resolve the symptoms. He is allergic to sulfa drugs and has tried other antibiotics in the past. Lesions have been lanced previously, aiding in drainage but not fully resolving the issue.  Currently, there is no fever or chills, and no active drainage from the lesions. A recent lesion was large and required lancing, which helped to some extent but the lesion is still present. He has stopped shaving the affected area to prevent further irritation and follicle entrapment.   Review of Systems  Constitutional:  Negative for chills and fever.  Respiratory:  Negative for shortness of breath.   Cardiovascular:  Negative for chest pain.  Skin:  Positive for rash.        Objective:    BP 124/82   Pulse 76   Temp 98.3 F (36.8 C) (Oral)   Ht 5' 9 (1.753 m)   Wt 219 lb 3.2 oz (99.4 kg)   SpO2 97%   BMI 32.37 kg/m  BP Readings from Last 3 Encounters:  09/21/24 124/82  08/11/24 118/76  04/05/24 130/88   Wt Readings from Last 3 Encounters:   09/21/24 219 lb 3.2 oz (99.4 kg)  08/11/24 219 lb (99.3 kg)  04/05/24 220 lb 3.2 oz (99.9 kg)   SpO2 Readings from Last 3 Encounters:  09/21/24 97%  08/11/24 97%  04/05/24 95%      Physical Exam Vitals and nursing note reviewed.  Constitutional:      Appearance: Normal appearance.  Cardiovascular:     Rate and Rhythm: Normal rate and regular rhythm.     Heart sounds: Normal heart sounds.  Pulmonary:     Effort: Pulmonary effort is normal.     Breath sounds: Normal breath sounds.  Skin:    Comments: deferred  Neurological:     Mental Status: He is alert.     No results found for any visits on 09/21/24.      Assessment & Plan:   Problem List Items Addressed This Visit   None Visit Diagnoses       Recurrent boils    -  Primary   Relevant Orders   Ambulatory referral to Dermatology      Assessment and Plan Assessment & Plan Hidradenitis suppurativa of the leg Chronic condition with exacerbation in summer. Partial relief with Augmentin . Allergy to sulfa drugs. No current infection signs. Dermatology evaluation pending. - Refer to dermatology at [facility] for evaluation and management. - Avoid immediate antibiotic treatment due to recent use and potential side effects. - Advised against shaving in affected  area to prevent symptom exacerbation.   No orders of the defined types were placed in this encounter.   Return if symptoms worsen or fail to improve.  Adina Crandall, NP

## 2024-10-08 ENCOUNTER — Encounter: Payer: Self-pay | Admitting: Nurse Practitioner

## 2024-10-11 MED ORDER — DRYSOL 20 % EX SOLN: Freq: Every day | CUTANEOUS | 0 refills | Status: DC

## 2024-11-30 ENCOUNTER — Ambulatory Visit

## 2024-11-30 DIAGNOSIS — L732 Hidradenitis suppurativa: Secondary | ICD-10-CM

## 2024-11-30 MED ORDER — CLINDAMYCIN PHOSPHATE 1 % EX SWAB: CUTANEOUS | 5 refills | Status: AC

## 2024-11-30 MED ORDER — DOXYCYCLINE MONOHYDRATE 100 MG PO TABS: 100.0000 mg | ORAL_TABLET | Freq: Two times a day (BID) | ORAL | 2 refills | Status: AC

## 2024-11-30 NOTE — Patient Instructions (Addendum)
 benzoyl peroxide wash 3-5% (brand name includes Neutragena Clear Pore, CereVe Acne Foaming Cream Cleanser, Differin Cleanser) that you use in the shower. Can bleach linens (clothes, towels, etc), will NOT bleach your skin or hair). Dry off with a white towel so it doesn't take the color out of clothing and colored towels.     Due to recent changes in healthcare laws, you may see results of your pathology and/or laboratory studies on MyChart before the doctors have had a chance to review them. We understand that in some cases there may be results that are confusing or concerning to you. Please understand that not all results are received at the same time and often the doctors may need to interpret multiple results in order to provide you with the best plan of care or course of treatment. Therefore, we ask that you please give us  2 business days to thoroughly review all your results before contacting the office for clarification. Should we see a critical lab result, you will be contacted sooner.   If You Need Anything After Your Visit  If you have any questions or concerns for your doctor, please call our main line at [phone] and press option 4 to reach your doctor's medical assistant. If no one answers, please leave a voicemail as directed and we will return your call as soon as possible. Messages left after 4 pm will be answered the following business day.   You may also send us  a message via MyChart. We typically respond to MyChart messages within 1-2 business days.  For prescription refills, please ask your pharmacy to contact our office. Our fax number is [phone].  If you have an urgent issue when the clinic is closed that cannot wait until the next business day, you can page your doctor at the number below.    Please note that while we do our best to be available for urgent issues outside of office hours, we are not available 24/7.   If you have an urgent issue and are unable to  reach us , you may choose to seek medical care at your doctor's office, retail clinic, urgent care center, or emergency room.  If you have a medical emergency, please immediately call 911 or go to the emergency department.  Pager Numbers  - Dr. Hester: [phone]  - Dr. Jackquline: [phone]  - Dr. Claudene: [phone]   - Dr. Raymund: [phone]  In the event of inclement weather, please call our main line at [phone] for an update on the status of any delays or closures.  Dermatology Medication Tips: Please keep the boxes that topical medications come in in order to help keep track of the instructions about where and how to use these. Pharmacies typically print the medication instructions only on the boxes and not directly on the medication tubes.   If your medication is too expensive, please contact our office at [phone] option 4 or send us  a message through MyChart.   We are unable to tell what your co-pay for medications will be in advance as this is different depending on your insurance coverage. However, we may be able to find a substitute medication at lower cost or fill out paperwork to get insurance to cover a needed medication.   If a prior authorization is required to get your medication covered by your insurance company, please allow us  1-2 business days to complete this process.  Drug prices often vary depending on where the prescription is filled and some pharmacies may  offer cheaper prices.  The website www.goodrx.com contains coupons for medications through different pharmacies. The prices here do not account for what the cost may be with help from insurance (it may be cheaper with your insurance), but the website can give you the price if you did not use any insurance.  - You can print the associated coupon and take it with your prescription to the pharmacy.  - You may also stop by our office during regular business hours and pick up a GoodRx coupon card.  -  If you need your prescription sent electronically to a different pharmacy, notify our office through [facility] or by phone at [phone] option 4.     Si Usted Necesita Algo Despus de Su Visita  Tambin puede enviarnos un mensaje a travs de Clinical cytogeneticist. Por lo general respondemos a los mensajes de MyChart en el transcurso de 1 a 2 das hbiles.  Para renovar recetas, por favor pida a su farmacia que se ponga en contacto con nuestra oficina. Randi lakes de fax es [place] [phone].  Si tiene un asunto urgente cuando la clnica est cerrada y que no puede esperar hasta el siguiente da hbil, puede llamar/localizar a su doctor(a) al nmero que aparece a continuacin.   Por favor, tenga en cuenta que aunque hacemos todo lo posible para estar disponibles para asuntos urgentes fuera del horario de [place], no estamos disponibles las 24 horas del da, los 7 [address]  [address] de la [place].   Si tiene un problema urgente y no puede comunicarse con nosotros, puede optar por buscar atencin mdica  en el consultorio de su doctor(a), en una clnica privada, en un centro de atencin urgente o en una sala de emergencias.  Si tiene Engineer, drilling, por favor llame inmediatamente al 911 o vaya a la sala de emergencias.  Nmeros de bper  - Dr. Hester: [phone]  - Dra. Jackquline: [phone]  - Dr. Claudene: [phone]  - Dra. Kitts: [phone]  En caso de inclemencias del [place], por favor llame a nuestra lnea principal al [phone] para una actualizacin sobre el estado de cualquier retraso o cierre.  Consejos para la medicacin en dermatologa: Por favor, guarde las cajas en las que vienen los medicamentos de uso tpico para ayudarle a seguir las instrucciones sobre dnde y cmo usarlos. Las farmacias generalmente imprimen las instrucciones del medicamento slo en las cajas y no directamente en los tubos del [place].   Si su medicamento es muy caro, por favor, pngase en  contacto con landry rieger llamando al [phone] y presione la opcin 4 o envenos un mensaje a travs de Clinical cytogeneticist.   No podemos decirle cul ser su copago por los medicamentos por adelantado ya que esto es diferente dependiendo de la cobertura de su seguro. Sin embargo, es posible que podamos encontrar un medicamento sustituto a Audiological scientist un formulario para que el seguro cubra el medicamento que se considera necesario.   Si se requiere una autorizacin previa para que su compaa de seguros malta su medicamento, por favor permtanos de 1 a 2 das hbiles para completar este proceso.  Los precios de los medicamentos varan con frecuencia dependiendo del Environmental consultant de dnde se surte la receta y alguna farmacias pueden ofrecer precios ms baratos.  El sitio web www.goodrx.com tiene cupones para medicamentos de Health and safety inspector. Los precios aqu no tienen en cuenta lo que podra costar con la ayuda del seguro (puede ser ms barato con su seguro), pero el sitio web puede darle  el precio si no Visual merchandiser.  - Puede imprimir el cupn correspondiente y llevarlo con su receta a la farmacia.  - Tambin puede pasar por nuestra oficina durante el horario de atencin regular y Education officer, museum una tarjeta de cupones de GoodRx.  - Si necesita que su receta se enve electrnicamente a una farmacia diferente, informe a nuestra oficina a travs de MyChart de [place] o por telfono llamando al [phone] y presione la opcin 4.

## 2024-11-30 NOTE — Progress Notes (Signed)
    Subjective   Tyler Ali is a 38 y.o. male who presents for the following: Lesion(s) of concern . Patient is new patient  Today patient reports: Abscess on the left thigh.  Has hx of recurrent abscess in groin   Review of Systems:    No other skin or systemic complaints except as noted in HPI or Assessment and Plan.  The following portions of the chart were reviewed this encounter and updated as appropriate: medications, allergies, medical history  Relevant Medical History:  n/a   Objective  (SKPE) Well appearing patient in no apparent distress; mood and affect are within normal limits. Examination was performed of the: Genital exam male: groin, mons pubis, penis, scrotum, buttocks   Examination notable for: Hidradenitis Suppurativa: Firm, erythematous papulonodules and cysts in the groin - L>R   Examination limited by: Undergarments and Patient deferred removal       Assessment & Plan  (SKAP)   Hidradenitis suppurativa, Hurley stage 2, moderate Chronic and persistent condition with duration or expected duration over one year. Condition is symptomatic and bothersome to patient. Patient is flaring and not currently at treatment goal.  - Discussed the chronic, relapsing nature of this skin disease, characterized by recurring inflamed painful nodules with abscess and sinus formation, and scarring - Explained to patient that early, isolated lesions can remit with medical treatment, but once sinus tracts are established treatment options are limited and surgery is the only definitive treatment; however, recurrence rate can be up to 25% even with surgery - Start doxycycline  100 mg BID x14 days as needed for flares  - Discussed side effects and precautions with doxycycline  including taking with meal, waiting at least 30 minutes before lying down at night, increased sun sensitivity, and to stop medication if becomes pregnant or breastfeeding - Start topical clindamycin  swabs 1% once  daily to active areas   - - start benzoyl peroxide  5% wash in the morning. Educated patient about proper use and potential side effects, including dryness, irritation, and bleaching of fabrics. - Discussed laser hair removal, ilk, biologics - will re visit at next visit     Procedures, orders, diagnosis for this visit:    There are no diagnoses linked to this encounter.  Return to clinic: Return 6-8 weeks, for HS.  I, Emerick Ege, CMA am acting as scribe for Lauraine JAYSON Kanaris, MD.   Documentation: I have reviewed the above documentation for accuracy and completeness, and I agree with the above.  Lauraine JAYSON Kanaris, MD

## 2024-12-05 ENCOUNTER — Encounter: Payer: Self-pay | Admitting: Nurse Practitioner

## 2024-12-06 MED ORDER — DRYSOL 20 % EX SOLN: Freq: Every day | CUTANEOUS | 2 refills | Status: AC

## 2025-01-12 ENCOUNTER — Ambulatory Visit

## 2025-01-12 DIAGNOSIS — L732 Hidradenitis suppurativa: Secondary | ICD-10-CM | POA: Diagnosis not present

## 2025-01-12 NOTE — Patient Instructions (Signed)

## 2025-01-12 NOTE — Progress Notes (Signed)
" °  °  Subjective   Tyler Ali is a 39 y.o. male who presents for the following: Follow up of HS. Patient is established patient   Today patient reports: Improvement in symptoms and no new outbreaks since last visit. He completed one course of Doxycycline .   Review of Systems:    No other skin or systemic complaints except as noted in HPI or Assessment and Plan.  The following portions of the chart were reviewed this encounter and updated as appropriate: medications, allergies, medical history  Relevant Medical History:  n/a   Objective  (SKPE) Well appearing patient in no apparent distress; mood and affect are within normal limits. Examination was performed of the: Focused Exam of: face, arms   Examination notable for: Clear; no flared HS lesions - defers genital exam  Examination limited by: Clothing and Patient deferred removal       Assessment & Plan  (SKAP)   Follow up Hidradenitis suppurativa, Hurley stage 2  Chronic and persistent condition with duration or expected duration over one year. Condition is symptomatic and bothersome to patient. Patient is at treatment goal  - Discussed the chronic, relapsing nature of this skin disease, characterized by recurring inflamed painful nodules with abscess and sinus formation, and scarring - Explained to patient that early, isolated lesions can remit with medical treatment, but once sinus tracts are established treatment options are limited and surgery is the only definitive treatment; however, recurrence rate can be up to 25% even with surgery - Continue doxycycline  100 mg BID x14 days as needed for flares  - Discussed side effects and precautions with doxycycline  including taking with meal, waiting at least 30 minutes before lying down at night, increased sun sensitivity, and to stop medication if becomes pregnant or breastfeeding - Continue topical clindamycin  swabs 1% once daily to active areas   - Conitnue benzoyl peroxide  5%  wash in the morning. Educated patient about proper use and potential side effects, including dryness, irritation, and bleaching of fabrics. - Discussed laser hair removal, ilk, biologics if not controlled on above   Was sun protection counseling provided?: No   Level of service outlined above   Patient instructions (SKPI)   Procedures, orders, diagnosis for this visit:    There are no diagnoses linked to this encounter.  Return to clinic: Return in about 6 months (around 07/12/2025) for HS.  I, Emerick Ege, CMA am acting as scribe for Lauraine JAYSON Kanaris, MD.   Documentation: I have reviewed the above documentation for accuracy and completeness, and I agree with the above.  Lauraine JAYSON Kanaris, MD  "

## 2025-07-13 ENCOUNTER — Ambulatory Visit
